# Patient Record
Sex: Male | Born: 2000 | Race: Black or African American | Hispanic: No | Marital: Single | Smoking: Never smoker
Health system: Southern US, Community
[De-identification: ages and names within clinical notes are randomized; demographics above are authoritative.]

---
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
## 2019-06-09 ENCOUNTER — Other Ambulatory Visit: Payer: Self-pay | Admitting: Adult Health

## 2019-06-09 MED ORDER — AZITHROMYCIN 500 MG PO TABS: ORAL_TABLET | ORAL | 0 refills | Status: AC

## 2019-06-09 NOTE — Progress Notes (Signed)
Partner(Kayla Hutchinson) +CHlamydia, will rx azithromycin 500 mg #2 2 po now

## 2021-06-01 ENCOUNTER — Other Ambulatory Visit: Payer: Self-pay

## 2021-06-01 ENCOUNTER — Encounter (HOSPITAL_COMMUNITY): Payer: Self-pay | Admitting: Emergency Medicine

## 2021-06-01 ENCOUNTER — Emergency Department (HOSPITAL_COMMUNITY)
Admission: EM | Admit: 2021-06-01 | Discharge: 2021-06-02 | Disposition: A | Discharge status: Home / Self Care | Payer: Medicaid Other | Attending: Emergency Medicine | Admitting: Emergency Medicine

## 2021-06-01 DIAGNOSIS — R21 Rash and other nonspecific skin eruption: Secondary | ICD-10-CM | POA: Insufficient documentation

## 2021-06-01 DIAGNOSIS — J45909 Unspecified asthma, uncomplicated: Secondary | ICD-10-CM | POA: Diagnosis not present

## 2021-06-01 NOTE — ED Triage Notes (Signed)
Pt c/o rash x one week all over.

## 2021-06-02 MED ORDER — PREDNISONE 20 MG PO TABS: 40.0000 mg | ORAL_TABLET | Freq: Every day | ORAL | 0 refills | Status: AC

## 2021-06-02 NOTE — Discharge Instructions (Signed)
You were evaluated in the Emergency Department and after careful evaluation, we did not find any emergent condition requiring admission or further testing in the hospital.  Your exam/testing today was overall reassuring.  Rash may be due to an allergic reaction.  Take the prednisone medication as directed.  Recommend follow-up with dermatology for further care.  Please return to the Emergency Department if you experience any worsening of your condition.  Thank you for allowing [place] to be a part of your care.

## 2021-06-02 NOTE — ED Provider Notes (Signed)
[facility] DEPT [facility] Emergency Department Provider Note MRN:  [id number]  Arrival date & time: 06/02/21     Chief Complaint   Rash   History of Present Illness   Levi Conrad is a 20 y.o. year-old male with a history of asthma presenting to the ED with chief complaint of rash.  Location: Total body Duration: 1 week Onset: Gradual Timing: Constant Description: Itchy and red Severity: Mild to moderate Exacerbating/Alleviating Factors: None Associated Symptoms: None Pertinent Negatives: No fever, no abdominal pain, no shortness of breath, no nausea vomiting or diarrhea  Additional History: No new exposures or medicines  Review of Systems  A complete 10 system review of systems was obtained and all systems are negative except as noted in the HPI and PMH.   Patient's Health History    Past Medical History:  Diagnosis Date   Asthma     History reviewed. No pertinent surgical history.  No family history on file.  Social History   Socioeconomic History   Marital status: Single    Spouse name: Not on file   Number of children: Not on file   Years of education: Not on file   Highest education level: Not on file  Occupational History   Not on file  Tobacco Use   Smoking status: Never   Smokeless tobacco: Never  Substance and Sexual Activity   Alcohol use: No   Drug use: No   Sexual activity: Not on file  Other Topics Concern   Not on file  Social History Narrative   Not on file   Social Determinants of Health   Financial Resource Strain: Not on file  Food Insecurity: Not on file  Transportation Needs: Not on file  Physical Activity: Not on file  Stress: Not on file  Social Connections: Not on file  Intimate Partner Violence: Not on file     Physical Exam   Vitals:   06/01/21 2158  BP: (!) 155/75  Pulse: 76  Resp: 16  Temp: 98.3 F (36.8 C)  SpO2: 100%    CONSTITUTIONAL: Well-appearing, NAD NEURO:  Alert and oriented x 3, no  focal deficits EYES:  eyes equal and reactive ENT/NECK:  no LAD, no JVD CARDIO: Regular rate, well-perfused, normal S1 and S2 PULM:  CTAB no wheezing or rhonchi GI/GU:  normal bowel sounds, non-distended, non-tender MSK/SPINE:  No gross deformities, no edema SKIN: Scattered papular erythematous rash to the arms, axilla, torso, legs PSYCH:  Appropriate speech and behavior  *Additional and/or pertinent findings included in MDM below  Diagnostic and Interventional Summary    EKG Interpretation  Date/Time:    Ventricular Rate:    PR Interval:    QRS Duration:   QT Interval:    QTC Calculation:   R Axis:     Text Interpretation:         Labs Reviewed - No data to display  No orders to display    Medications - No data to display   Procedures  /  Critical Care Procedures  ED Course and Medical Decision Making  I have reviewed the triage vital signs, the nursing notes, and pertinent available records from the EMR.  Listed above are laboratory and imaging tests that I personally ordered, reviewed, and interpreted and then considered in my medical decision making (see below for details).  Blanching papular rash, mildly itchy, no other symptoms, spares the palms and soles.  Suspect allergy versus viral exanthem.  Will trial prednisone and refer to  dermatology.       Elmer Sow. Pilar Plate, MD [facility] Emergency Medicine [facility] [email or web address]  Final Clinical Impressions(s) / ED Diagnoses     ICD-10-CM   1. Rash  R21       ED Discharge Orders          Ordered    predniSONE (DELTASONE) 20 MG tablet  Daily        06/02/21 0036             Discharge Instructions Discussed with and Provided to Patient:    Discharge Instructions      You were evaluated in the Emergency Department and after careful evaluation, we did not find any emergent condition requiring admission or further testing in the hospital.  Your exam/testing today was  overall reassuring.  Rash may be due to an allergic reaction.  Take the prednisone medication as directed.  Recommend follow-up with dermatology for further care.  Please return to the Emergency Department if you experience any worsening of your condition.  Thank you for allowing [place] to be a part of your care.        Sabas Sous, MD 06/02/21 [phone]

## 2024-01-07 ENCOUNTER — Ambulatory Visit: Payer: Self-pay | Admitting: Dermatology
